# Patient Record
Sex: Female | Born: 1980 | Race: Black or African American | Hispanic: No | Marital: Single | Smoking: Never smoker
Health system: Southern US, Community
[De-identification: ages and names within clinical notes are randomized; demographics above are authoritative.]

## PROBLEM LIST (undated history)

## (undated) DIAGNOSIS — D649 Anemia, unspecified: Secondary | ICD-10-CM

## (undated) DIAGNOSIS — Z8489 Family history of other specified conditions: Secondary | ICD-10-CM

## (undated) DIAGNOSIS — F419 Anxiety disorder, unspecified: Secondary | ICD-10-CM

## (undated) DIAGNOSIS — N3090 Cystitis, unspecified without hematuria: Secondary | ICD-10-CM

## (undated) DIAGNOSIS — R200 Anesthesia of skin: Secondary | ICD-10-CM

## (undated) DIAGNOSIS — G935 Compression of brain: Secondary | ICD-10-CM

## (undated) HISTORY — PX: OTHER SURGICAL HISTORY: SHX169

## (undated) HISTORY — PX: WISDOM TOOTH EXTRACTION: SHX21

---

## 2015-08-29 DIAGNOSIS — R200 Anesthesia of skin: Secondary | ICD-10-CM

## 2015-08-29 HISTORY — DX: Anesthesia of skin: R20.0

## 2016-08-28 HISTORY — PX: CRANIECTOMY: SHX331

## 2017-07-06 ENCOUNTER — Other Ambulatory Visit: Payer: Self-pay | Admitting: Obstetrics and Gynecology

## 2017-07-06 DIAGNOSIS — D259 Leiomyoma of uterus, unspecified: Secondary | ICD-10-CM

## 2017-07-24 ENCOUNTER — Ambulatory Visit
Admission: RE | Admit: 2017-07-24 | Discharge: 2017-07-24 | Disposition: A | Discharge status: Home / Self Care | Payer: 59 | Source: Ambulatory Visit | Attending: Obstetrics and Gynecology | Admitting: Obstetrics and Gynecology

## 2017-07-24 DIAGNOSIS — D259 Leiomyoma of uterus, unspecified: Secondary | ICD-10-CM

## 2017-07-24 MED ORDER — GADOBENATE DIMEGLUMINE 529 MG/ML IV SOLN
20.0000 mL | Freq: Once | INTRAVENOUS | Status: AC | PRN
  Administered 2017-07-24: 20 mL via INTRAVENOUS

## 2017-08-17 ENCOUNTER — Other Ambulatory Visit: Payer: Self-pay | Admitting: Obstetrics and Gynecology

## 2017-08-17 DIAGNOSIS — R1900 Intra-abdominal and pelvic swelling, mass and lump, unspecified site: Secondary | ICD-10-CM

## 2017-08-22 ENCOUNTER — Ambulatory Visit
Admission: RE | Admit: 2017-08-22 | Discharge: 2017-08-22 | Disposition: A | Discharge status: Home / Self Care | Payer: 59 | Source: Ambulatory Visit | Attending: Obstetrics and Gynecology | Admitting: Obstetrics and Gynecology

## 2017-08-22 DIAGNOSIS — R1900 Intra-abdominal and pelvic swelling, mass and lump, unspecified site: Secondary | ICD-10-CM

## 2017-08-22 MED ORDER — IOPAMIDOL (ISOVUE-300) INJECTION 61%
125.0000 mL | Freq: Once | INTRAVENOUS | Status: AC | PRN
  Administered 2017-08-22: 125 mL via INTRAVENOUS

## 2017-08-29 ENCOUNTER — Ambulatory Visit: Payer: 59 | Admitting: Gynecologic Oncology

## 2017-10-05 ENCOUNTER — Other Ambulatory Visit: Payer: Self-pay | Admitting: Obstetrics and Gynecology

## 2017-10-09 NOTE — Patient Instructions (Addendum)
Your procedure is scheduled on: Thursday, Feb 21  Enter through the Main Entrance of [facility] at: 11:30 am  Pick up the phone at the desk and dial [phone].  Call this number if you have problems the morning of surgery: [phone].  Remember: Do NOT eat or Do NOT drink clear liquids (including water) after midnight Wednesday.  Take these medicines the morning of surgery with a SIP OF WATER: None  Stop herbal medications and supplements at this time.  Do NOT wear jewelry (body piercing), metal hair clips/bobby pins, make-up, or nail polish. Do NOT wear lotions, powders, or perfumes.  You may wear deoderant. Do NOT shave for 48 hours prior to surgery. Do NOT bring valuables to the hospital.  Leave suitcase in car.  After surgery it may be brought to your room.  For patients admitted to the hospital, checkout time is 11:00 AM the day of discharge. Have a responsible adult drive you home and stay with you for 24 hours after your procedure. Home with Mother Melanie Faulkner cell [phone].

## 2017-10-12 ENCOUNTER — Other Ambulatory Visit: Payer: Self-pay

## 2017-10-12 ENCOUNTER — Encounter (HOSPITAL_COMMUNITY): Payer: Self-pay

## 2017-10-12 ENCOUNTER — Encounter (HOSPITAL_COMMUNITY)
Admission: RE | Admit: 2017-10-12 | Discharge: 2017-10-12 | Disposition: A | Discharge status: Home / Self Care | Payer: 59 | Source: Ambulatory Visit | Attending: Obstetrics and Gynecology | Admitting: Obstetrics and Gynecology

## 2017-10-12 DIAGNOSIS — Z01812 Encounter for preprocedural laboratory examination: Secondary | ICD-10-CM | POA: Insufficient documentation

## 2017-10-12 HISTORY — DX: Anemia, unspecified: D64.9

## 2017-10-12 LAB — BASIC METABOLIC PANEL
Anion gap: 7 (ref 5–15)
BUN: 11 mg/dL (ref 6–20)
CALCIUM: 9.3 mg/dL (ref 8.9–10.3)
CO2: 25 mmol/L (ref 22–32)
Chloride: 104 mmol/L (ref 101–111)
Creatinine, Ser: 0.7 mg/dL (ref 0.44–1.00)
GFR calc Af Amer: >60 mL/min (ref >60)
GLUCOSE: 110 mg/dL — AB (ref 65–99)
Potassium: 3.8 mmol/L (ref 3.5–5.1)
Sodium: 136 mmol/L (ref 135–145)

## 2017-10-12 LAB — CBC
HEMATOCRIT: 28.8 % — AB (ref 36.0–46.0)
Hemoglobin: 7.8 g/dL — ABNORMAL LOW (ref 12.0–15.0)
MCH: 18.2 pg — ABNORMAL LOW (ref 26.0–34.0)
MCHC: 27.1 g/dL — AB (ref 30.0–36.0)
MCV: 67.3 fL — ABNORMAL LOW (ref 78.0–100.0)
Platelets: 287 10*3/uL (ref 150–400)
RBC: 4.28 MIL/uL (ref 3.87–5.11)
RDW: 21.7 % — AB (ref 11.5–15.5)
WBC: 5.4 10*3/uL (ref 4.0–10.5)

## 2017-10-12 LAB — ABO/RH: ABO/RH(D): AB POS

## 2017-10-12 NOTE — Pre-Procedure Instructions (Signed)
Dr. Royce Macadamia notified that patient's hgb today was 7.8.  Patient is taking iron supplement.  Telephone order to type and crossmatch 2 units of blood for DOS.  Verified verbal order.  Order was placed in Epic for DOS.

## 2017-10-15 ENCOUNTER — Encounter (HOSPITAL_COMMUNITY): Payer: Self-pay | Admitting: Anesthesiology

## 2017-10-18 ENCOUNTER — Encounter (HOSPITAL_COMMUNITY): Payer: Self-pay | Admitting: Emergency Medicine

## 2017-10-18 ENCOUNTER — Other Ambulatory Visit: Payer: Self-pay | Admitting: Obstetrics and Gynecology

## 2017-10-18 ENCOUNTER — Ambulatory Visit: Admit: 2017-10-18 | Payer: 59 | Admitting: Obstetrics and Gynecology

## 2017-10-18 ENCOUNTER — Ambulatory Visit (HOSPITAL_COMMUNITY)
Admission: AD | Admit: 2017-10-18 | Discharge: 2017-10-18 | DRG: 761 | Disposition: A | Discharge status: Home / Self Care | Payer: 59 | Source: Ambulatory Visit | Attending: Obstetrics and Gynecology | Admitting: Obstetrics and Gynecology

## 2017-10-18 ENCOUNTER — Encounter (HOSPITAL_COMMUNITY)
Admission: AD | Disposition: A | Discharge status: Home / Self Care | Payer: Self-pay | Source: Ambulatory Visit | Attending: Obstetrics and Gynecology

## 2017-10-18 DIAGNOSIS — Z5309 Procedure and treatment not carried out because of other contraindication: Secondary | ICD-10-CM | POA: Diagnosis present

## 2017-10-18 DIAGNOSIS — Z79899 Other long term (current) drug therapy: Secondary | ICD-10-CM | POA: Diagnosis not present

## 2017-10-18 DIAGNOSIS — Z882 Allergy status to sulfonamides status: Secondary | ICD-10-CM | POA: Diagnosis not present

## 2017-10-18 DIAGNOSIS — D259 Leiomyoma of uterus, unspecified: Secondary | ICD-10-CM | POA: Diagnosis not present

## 2017-10-18 HISTORY — DX: Compression of brain: G93.5

## 2017-10-18 LAB — PREGNANCY, URINE: PREG TEST UR: NEGATIVE

## 2017-10-18 LAB — PREPARE RBC (CROSSMATCH)

## 2017-10-18 SURGERY — LAPAROTOMY, EXPLORATORY
Anesthesia: General

## 2017-10-18 SURGERY — HYSTERECTOMY, TOTAL, ABDOMINAL, WITH SALPINGECTOMY
Anesthesia: General | Site: Abdomen | Laterality: Bilateral

## 2017-10-18 MED ORDER — LIDOCAINE HCL (CARDIAC) 20 MG/ML IV SOLN
INTRAVENOUS | Status: AC
  Filled 2017-10-18: qty 5

## 2017-10-18 MED ORDER — SCOPOLAMINE 1 MG/3DAYS TD PT72
MEDICATED_PATCH | TRANSDERMAL | Status: AC
  Administered 2017-10-18: 1.5 mg via TRANSDERMAL
  Filled 2017-10-18: qty 1

## 2017-10-18 MED ORDER — DEXTROSE 5 % IV SOLN
3.0000 g | INTRAVENOUS | Status: AC
  Filled 2017-10-18: qty 3000

## 2017-10-18 MED ORDER — ONDANSETRON HCL 4 MG/2ML IJ SOLN
INTRAMUSCULAR | Status: AC
  Filled 2017-10-18: qty 2

## 2017-10-18 MED ORDER — SODIUM CHLORIDE 0.9 % IV SOLN
510.0000 mg | INTRAVENOUS | Status: DC
  Filled 2017-10-18: qty 17

## 2017-10-18 MED ORDER — MIDAZOLAM HCL 2 MG/2ML IJ SOLN
INTRAMUSCULAR | Status: AC
  Filled 2017-10-18: qty 2

## 2017-10-18 MED ORDER — PROPOFOL 10 MG/ML IV BOLUS
INTRAVENOUS | Status: AC
  Filled 2017-10-18: qty 20

## 2017-10-18 MED ORDER — FENTANYL CITRATE (PF) 250 MCG/5ML IJ SOLN
INTRAMUSCULAR | Status: AC
  Filled 2017-10-18: qty 5

## 2017-10-18 MED ORDER — DEXAMETHASONE SODIUM PHOSPHATE 10 MG/ML IJ SOLN
INTRAMUSCULAR | Status: AC
  Filled 2017-10-18: qty 1

## 2017-10-18 MED ORDER — LACTATED RINGERS IV SOLN
INTRAVENOUS | Status: DC
  Administered 2017-10-18: 12:00:00 via INTRAVENOUS

## 2017-10-18 MED ORDER — SCOPOLAMINE 1 MG/3DAYS TD PT72
1.0000 | MEDICATED_PATCH | Freq: Once | TRANSDERMAL | Status: AC
  Administered 2017-10-18: 1.5 mg via TRANSDERMAL

## 2017-10-18 NOTE — OR Nursing (Signed)
Pt surgery was canceled. Verbal orders received from Dr. Maudry Diego for an IV Iron transfusion prior to discharge. Iron transfusion was given but I was unable to chart it because pt was canceled in Epic prior to charting.   Therefore.Melanie Faulkner 510mg /171ml IV was given over 15 mins via IV pump. Pt was monitored by continous pulse ox and observed for 30 minutes post infusion.  Pt showed no signs of allergic reaction.  Pt discharged in NAD.

## 2017-10-18 NOTE — Anesthesia Preprocedure Evaluation (Deleted)
Anesthesia Evaluation  Patient identified by MRN, date of birth, ID band Patient awake    Reviewed: Allergy & Precautions, NPO status , Patient's Chart, lab work & pertinent test results  Airway Mallampati: II  TM Distance: >3 FB Neck ROM: Full    Dental no notable dental hx.    Pulmonary neg pulmonary ROS,    Pulmonary exam normal breath sounds clear to auscultation       Cardiovascular negative cardio ROS Normal cardiovascular exam Rhythm:Regular Rate:Normal     Neuro/Psych negative neurological ROS  negative psych ROS   GI/Hepatic negative GI ROS, Neg liver ROS,   Endo/Other  negative endocrine ROS  Renal/GU negative Renal ROS  negative genitourinary   Musculoskeletal negative musculoskeletal ROS (+)   Abdominal   Peds  Hematology  (+) anemia ,   Anesthesia Other Findings   Reproductive/Obstetrics negative OB ROS                            Lab Results  Component Value Date   WBC 5.4 10/12/2017   HGB 7.8 (L) 10/12/2017   HCT 28.8 (L) 10/12/2017   MCV 67.3 (L) 10/12/2017   PLT 287 10/12/2017   Lab Results  Component Value Date   CREATININE 0.70 10/12/2017   BUN 11 10/12/2017   NA 136 10/12/2017   K 3.8 10/12/2017   CL 104 10/12/2017   CO2 25 10/12/2017    Anesthesia Physical Anesthesia Plan  ASA: IV  Anesthesia Plan: General   Post-op Pain Management:    Induction: Intravenous  PONV Risk Score and Plan: 4 or greater and Treatment may vary due to age or medical condition, Ondansetron, Dexamethasone and Scopolamine patch - Pre-op  Airway Management Planned: Oral ETT  Additional Equipment:   Intra-op Plan:   Post-operative Plan: Extubation in OR  Informed Consent: I have reviewed the patients History and Physical, chart, labs and discussed the procedure including the risks, benefits and alternatives for the proposed anesthesia with the patient or authorized  representative who has indicated his/her understanding and acceptance.   Dental advisory given  Plan Discussed with: CRNA  Anesthesia Plan Comments:         Anesthesia Quick Evaluation

## 2017-10-18 NOTE — H&P (Signed)
Melanie Faulkner is an 37 y.o. female Go SBF presents for planned exp lap, TAH, bilateral salpingectomy due to symptomatic uterine fibroid. CT scan/MRI Showed 19 cm fibroid uterus. EBX benign endometrium Pertinent Gynecological History: Menses: regular Bleeding: nl Contraception: none DES exposure: denies Blood transfusions: none Sexually transmitted diseases: no past history Previous GYN Procedures: none  Last mammogram: n/a Date: 2018 Last pap: abnormal: LSIL (+) HPV Date: 09/2016  OB History: G0   Menstrual History: Menarche age: n/a Patient's last menstrual period was 09/28/2017 (exact date).    Past Medical History:  Diagnosis Date  . Anemia   . 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 CRANIECTOMY  2018   Nolene Ebbs 1 Malformatin w/syrina -[facility]  . saliva gland removed    . WISDOM TOOTH EXTRACTION      History reviewed. No pertinent family history.  Social History:  reports that  has never smoked. she has never used smokeless tobacco. She reports that she does not drink alcohol or use drugs.  Allergies:  Allergies  Allergen Reactions  . Sulfa Antibiotics Other (See Comments)    Fever/chills    Medications Prior to Admission  Medication Sig Dispense Refill Last Dose  . aspirin-acetaminophen-caffeine (EXCEDRIN MIGRAINE) 250-250-65 MG tablet Take 1-2 tablets by mouth daily as needed for headache.   Past Month at Unknown time  . CHASTE TREE PO Take 1 capsule by mouth daily.   Past Month at Unknown time  . IRON PO Take 1 tablet by mouth daily.   10/17/2017 at Unknown time  . Noni, Morinda citrifolia, (NONI JUICE PO) Take 30 mLs by mouth 2 (two) times daily.   10/17/2017 at Unknown time  . RASPBERRY PO Take 1 capsule by mouth daily. Red Raspberry   Past Month at Unknown time  . RED CLOVER LEAF EXTRACT PO Take 1 capsule by mouth daily.   Past Month at Unknown time  . VALERIAN PO Take 2-3 capsules by mouth  at bedtime.   Past Month at Unknown time  . ibuprofen (ADVIL,MOTRIN) 200 MG tablet Take 800 mg by mouth at bedtime.   More than a month at Unknown time    Review of Systems  All other systems reviewed and are negative.   Blood pressure 138/77, pulse 97, temperature 97.8 F (36.6 C), temperature source Oral, resp. rate 16, height 5\' 4"  (1.626 m), weight 120.7 kg (266 lb), last menstrual period 09/28/2017, SpO2 100 %. Physical Exam  Constitutional: She is oriented to person, place, and time. She appears well-developed and well-nourished.  HENT:  Head: Atraumatic.  Eyes: EOM are normal.  Neck: Neck supple.  Respiratory: Breath sounds normal.  GI: She exhibits mass.  @ umbilicus  Genitourinary:  Genitourinary Comments: Vulva no lesion Vagina: no lesion Cervix closed Uterus 20 weeks irreg Adnexa nonpalp  Neurological: She is alert and oriented to person, place, and time.  Skin: Skin is warm and dry.  Psychiatric: She has a normal mood and affect.    Results for orders placed or performed during the hospital encounter of 10/18/17 (from the past 24 hour(s))  Prepare RBC (crossmatch)     Status: None   Collection Time: 10/18/17 11:30 AM  Result Value Ref Range   Order Confirmation      ORDER PROCESSED BY BLOOD BANK Performed at [facility], [address], [place], [place] [address]     No results found.  Assessment/Plan: Symptomatic uterine fibroid  P)exp lap, TAH, bilateral salpingectomy Risk of surgery includes infection, bleeding, injury to bladder, ureter, bowels, possible need for blood transfusion and its risk( HIV, fever/rash, hepatitis), poss need for removal of ovaries in the future due to malignancy, pain etc. All ? Answered. No future fertility or childbearing ability  Stephan Draughn A Lilliemae Fruge 10/18/2017, 11:33 AM

## 2017-10-18 NOTE — Progress Notes (Signed)
Pt presented for surgery hgb 7.8 Unknownst to me. Pt was informed of this finding. Surgery is therefore cancelled  Pt understands. Will give one IV iron infusion today and repeat one week later. Agree with plan. Will also look at lupron .

## 2017-10-19 ENCOUNTER — Other Ambulatory Visit: Payer: Self-pay | Admitting: Obstetrics and Gynecology

## 2017-10-25 ENCOUNTER — Ambulatory Visit (HOSPITAL_COMMUNITY)
Admission: RE | Admit: 2017-10-25 | Discharge: 2017-10-25 | Disposition: A | Discharge status: Home / Self Care | Payer: 59 | Source: Ambulatory Visit | Attending: Obstetrics and Gynecology | Admitting: Obstetrics and Gynecology

## 2017-10-25 ENCOUNTER — Ambulatory Visit (HOSPITAL_COMMUNITY): Payer: Self-pay

## 2017-10-25 ENCOUNTER — Ambulatory Visit (HOSPITAL_COMMUNITY): Payer: 59

## 2017-10-25 DIAGNOSIS — D509 Iron deficiency anemia, unspecified: Secondary | ICD-10-CM | POA: Insufficient documentation

## 2017-10-25 MED ORDER — SODIUM CHLORIDE 0.9 % IV SOLN
510.0000 mg | Freq: Once | INTRAVENOUS | Status: AC
  Administered 2017-10-25: 510 mg via INTRAVENOUS
  Filled 2017-10-25: qty 17

## 2017-10-25 MED ORDER — SODIUM CHLORIDE 0.9 % IV SOLN: 510.0000 mg | Freq: Once | INTRAVENOUS | Status: DC

## 2017-10-25 NOTE — Discharge Instructions (Signed)

## 2017-10-25 NOTE — Progress Notes (Signed)
PATIENT CARE CENTER NOTE  Diagnosis: Iron Deficiency Anemia    Provider: Dr. Robert Bellow   Procedure: IV Iron infusion (Feraheme)   Note: Patient received infusion of IV Feraheme. Patient tolerated infusion well with no adverse reaction. Vitals stable and patient monitored 30 minutes after completion of transfusion. Discharge instructions given to patient. Patient alert, oriented and ambulatory at discharge.

## 2017-10-26 LAB — BPAM RBC
Blood Product Expiration Date: 201903142359
Blood Product Expiration Date: 201903142359
Unit Type and Rh: 6200
Unit Type and Rh: 6200

## 2017-10-26 LAB — TYPE AND SCREEN
ABO/RH(D): AB POS
ANTIBODY SCREEN: NEGATIVE
UNIT DIVISION: 0
Unit division: 0

## 2017-11-30 ENCOUNTER — Other Ambulatory Visit: Payer: Self-pay | Admitting: Obstetrics and Gynecology

## 2017-12-04 NOTE — Patient Instructions (Addendum)
Your procedure is scheduled on: Monday December 17, 2017 at 1:00 pm  Enter through the Main Entrance of [facility] at: 11:30 am  Pick up the phone at the desk and dial [phone].  Call this number if you have problems the morning of surgery: [phone].  Remember: Do NOT eat food: after Midnight on Sunday April 21  Do NOT drink clear liquids after: 6:00 am day of surgery Take these medicines the morning of surgery with a SIP OF WATER: None  STOP ALL VITAMINS AND SUPPLEMENTS 1 WEEK PRIOR TO SURGERY  DO NOT SMOKE DAY OF SURGERY  Do NOT wear jewelry (body piercing), metal hair clips/bobby pins, make-up, or nail polish. Do NOT wear lotions, powders, or perfumes.  You may wear deoderant. Do NOT shave for 48 hours prior to surgery. Do NOT bring valuables to the hospital. Contacts, dentures, or bridgework may not be worn into surgery. Leave suitcase in car.  After surgery it may be brought to your room.    For patients admitted to the hospital, checkout time is 11:00 AM the day of discharge.

## 2017-12-07 ENCOUNTER — Encounter (HOSPITAL_COMMUNITY): Payer: Self-pay

## 2017-12-07 ENCOUNTER — Encounter (HOSPITAL_COMMUNITY)
Admission: RE | Admit: 2017-12-07 | Discharge: 2017-12-07 | Disposition: A | Discharge status: Home / Self Care | Payer: 59 | Source: Ambulatory Visit | Attending: Obstetrics and Gynecology | Admitting: Obstetrics and Gynecology

## 2017-12-07 ENCOUNTER — Other Ambulatory Visit: Payer: Self-pay

## 2017-12-07 DIAGNOSIS — Z01812 Encounter for preprocedural laboratory examination: Secondary | ICD-10-CM | POA: Diagnosis present

## 2017-12-07 HISTORY — DX: Anxiety disorder, unspecified: F41.9

## 2017-12-07 HISTORY — DX: Cystitis, unspecified without hematuria: N30.90

## 2017-12-07 HISTORY — DX: Anesthesia of skin: R20.0

## 2017-12-07 HISTORY — DX: Family history of other specified conditions: Z84.89

## 2017-12-07 LAB — CBC
HEMATOCRIT: 39.7 % (ref 36.0–46.0)
HEMOGLOBIN: 12.3 g/dL (ref 12.0–15.0)
MCH: 24.9 pg — ABNORMAL LOW (ref 26.0–34.0)
MCHC: 31 g/dL (ref 30.0–36.0)
MCV: 80.4 fL (ref 78.0–100.0)
PLATELETS: 183 10*3/uL (ref 150–400)
RBC: 4.94 MIL/uL (ref 3.87–5.11)
RDW: 24.7 % — AB (ref 11.5–15.5)
WBC: 3.7 10*3/uL — AB (ref 4.0–10.5)

## 2017-12-07 LAB — BASIC METABOLIC PANEL
ANION GAP: 6 (ref 5–15)
BUN: 10 mg/dL (ref 6–20)
CO2: 27 mmol/L (ref 22–32)
Calcium: 9.9 mg/dL (ref 8.9–10.3)
Chloride: 104 mmol/L (ref 101–111)
Creatinine, Ser: 0.68 mg/dL (ref 0.44–1.00)
GFR calc Af Amer: >60 mL/min (ref >60)
Glucose, Bld: 90 mg/dL (ref 65–99)
POTASSIUM: 3.8 mmol/L (ref 3.5–5.1)
SODIUM: 137 mmol/L (ref 135–145)

## 2017-12-07 LAB — TYPE AND SCREEN
ABO/RH(D): AB POS
Antibody Screen: NEGATIVE

## 2017-12-17 ENCOUNTER — Encounter (HOSPITAL_COMMUNITY): Payer: Self-pay | Admitting: Anesthesiology

## 2017-12-17 ENCOUNTER — Inpatient Hospital Stay (HOSPITAL_COMMUNITY): Payer: 59 | Admitting: Anesthesiology

## 2017-12-17 ENCOUNTER — Encounter (HOSPITAL_COMMUNITY)
Admission: RE | Disposition: A | Discharge status: Home / Self Care | Payer: Self-pay | Source: Ambulatory Visit | Attending: Obstetrics and Gynecology

## 2017-12-17 ENCOUNTER — Inpatient Hospital Stay (HOSPITAL_COMMUNITY)
Admission: RE | Admit: 2017-12-17 | Discharge: 2017-12-18 | DRG: 742 | Disposition: A | Discharge status: Home / Self Care | Payer: 59 | Attending: Obstetrics and Gynecology | Admitting: Obstetrics and Gynecology

## 2017-12-17 ENCOUNTER — Other Ambulatory Visit: Payer: Self-pay

## 2017-12-17 DIAGNOSIS — Z6841 Body Mass Index (BMI) 40.0 and over, adult: Secondary | ICD-10-CM | POA: Diagnosis not present

## 2017-12-17 DIAGNOSIS — D259 Leiomyoma of uterus, unspecified: Principal | ICD-10-CM | POA: Diagnosis present

## 2017-12-17 DIAGNOSIS — D649 Anemia, unspecified: Secondary | ICD-10-CM | POA: Diagnosis present

## 2017-12-17 DIAGNOSIS — Z90710 Acquired absence of both cervix and uterus: Secondary | ICD-10-CM | POA: Diagnosis present

## 2017-12-17 HISTORY — PX: HYSTERECTOMY ABDOMINAL WITH SALPINGECTOMY: SHX6725

## 2017-12-17 LAB — PREGNANCY, URINE: Preg Test, Ur: NEGATIVE

## 2017-12-17 SURGERY — HYSTERECTOMY, TOTAL, ABDOMINAL, WITH SALPINGECTOMY
Anesthesia: General | Site: Abdomen | Laterality: Bilateral

## 2017-12-17 MED ORDER — SIMETHICONE 80 MG PO CHEW: 80.0000 mg | CHEWABLE_TABLET | Freq: Four times a day (QID) | ORAL | Status: DC | PRN

## 2017-12-17 MED ORDER — PROPOFOL 10 MG/ML IV BOLUS
INTRAVENOUS | Status: DC | PRN
  Administered 2017-12-17: 200 mg via INTRAVENOUS

## 2017-12-17 MED ORDER — FENTANYL CITRATE (PF) 100 MCG/2ML IJ SOLN
INTRAMUSCULAR | Status: DC | PRN
  Administered 2017-12-17: 100 ug via INTRAVENOUS
  Administered 2017-12-17: 50 ug via INTRAVENOUS
  Administered 2017-12-17: 100 ug via INTRAVENOUS
  Administered 2017-12-17: 25 ug via INTRAVENOUS
  Administered 2017-12-17: 100 ug via INTRAVENOUS
  Administered 2017-12-17: 25 ug via INTRAVENOUS

## 2017-12-17 MED ORDER — HYDROMORPHONE HCL 1 MG/ML IJ SOLN
INTRAMUSCULAR | Status: DC | PRN
  Administered 2017-12-17: 1 mg via INTRAVENOUS

## 2017-12-17 MED ORDER — SCOPOLAMINE 1 MG/3DAYS TD PT72
1.0000 | MEDICATED_PATCH | Freq: Once | TRANSDERMAL | Status: DC
  Administered 2017-12-17: 1.5 mg via TRANSDERMAL

## 2017-12-17 MED ORDER — PROMETHAZINE HCL 25 MG/ML IJ SOLN: 6.2500 mg | INTRAMUSCULAR | Status: DC | PRN

## 2017-12-17 MED ORDER — PROPOFOL 10 MG/ML IV BOLUS
INTRAVENOUS | Status: AC
  Filled 2017-12-17: qty 20

## 2017-12-17 MED ORDER — KETOROLAC TROMETHAMINE 30 MG/ML IJ SOLN
INTRAMUSCULAR | Status: DC | PRN
  Administered 2017-12-17: 30 mg via INTRAVENOUS

## 2017-12-17 MED ORDER — SODIUM CHLORIDE 0.9 % IJ SOLN
INTRAMUSCULAR | Status: AC
  Filled 2017-12-17: qty 50

## 2017-12-17 MED ORDER — SODIUM CHLORIDE 0.9% FLUSH: 9.0000 mL | INTRAVENOUS | Status: DC | PRN

## 2017-12-17 MED ORDER — LIDOCAINE HCL (CARDIAC) PF 100 MG/5ML IV SOSY
PREFILLED_SYRINGE | INTRAVENOUS | Status: AC
  Filled 2017-12-17: qty 5

## 2017-12-17 MED ORDER — KETOROLAC TROMETHAMINE 30 MG/ML IJ SOLN
30.0000 mg | Freq: Four times a day (QID) | INTRAMUSCULAR | Status: DC
  Filled 2017-12-17: qty 1

## 2017-12-17 MED ORDER — DIPHENHYDRAMINE HCL 12.5 MG/5ML PO ELIX: 12.5000 mg | ORAL_SOLUTION | Freq: Four times a day (QID) | ORAL | Status: DC | PRN

## 2017-12-17 MED ORDER — MIDAZOLAM HCL 2 MG/2ML IJ SOLN
INTRAMUSCULAR | Status: AC
  Filled 2017-12-17: qty 2

## 2017-12-17 MED ORDER — MENTHOL 3 MG MT LOZG
1.0000 | LOZENGE | OROMUCOSAL | Status: DC | PRN
Start: 2017-12-17 — End: 2017-12-18

## 2017-12-17 MED ORDER — SCOPOLAMINE 1 MG/3DAYS TD PT72
MEDICATED_PATCH | TRANSDERMAL | Status: AC
  Administered 2017-12-17: 1.5 mg via TRANSDERMAL
  Filled 2017-12-17: qty 1

## 2017-12-17 MED ORDER — MIDAZOLAM HCL 2 MG/2ML IJ SOLN
INTRAMUSCULAR | Status: DC | PRN
  Administered 2017-12-17: 0.5 mg via INTRAVENOUS
  Administered 2017-12-17: 1.5 mg via INTRAVENOUS

## 2017-12-17 MED ORDER — CEFAZOLIN SODIUM-DEXTROSE 2-4 GM/100ML-% IV SOLN
INTRAVENOUS | Status: AC
  Filled 2017-12-17: qty 100

## 2017-12-17 MED ORDER — HYDROMORPHONE HCL 1 MG/ML IJ SOLN: 0.2500 mg | INTRAMUSCULAR | Status: DC | PRN

## 2017-12-17 MED ORDER — ONDANSETRON HCL 4 MG/2ML IJ SOLN
4.0000 mg | Freq: Four times a day (QID) | INTRAMUSCULAR | Status: DC | PRN
Start: 2017-12-17 — End: 2017-12-18

## 2017-12-17 MED ORDER — KETOROLAC TROMETHAMINE 30 MG/ML IJ SOLN
30.0000 mg | Freq: Four times a day (QID) | INTRAMUSCULAR | Status: DC
  Administered 2017-12-17 – 2017-12-18 (×3): 30 mg via INTRAVENOUS
  Filled 2017-12-17 (×2): qty 1

## 2017-12-17 MED ORDER — SUGAMMADEX SODIUM 500 MG/5ML IV SOLN
INTRAVENOUS | Status: AC
  Filled 2017-12-17: qty 5

## 2017-12-17 MED ORDER — LIDOCAINE HCL (CARDIAC) PF 100 MG/5ML IV SOSY
PREFILLED_SYRINGE | INTRAVENOUS | Status: DC | PRN
  Administered 2017-12-17: 80 mg via INTRAVENOUS

## 2017-12-17 MED ORDER — CEFAZOLIN SODIUM-DEXTROSE 2-4 GM/100ML-% IV SOLN
2.0000 g | INTRAVENOUS | Status: AC
  Administered 2017-12-17: 2 g via INTRAVENOUS

## 2017-12-17 MED ORDER — DEXAMETHASONE SODIUM PHOSPHATE 10 MG/ML IJ SOLN
INTRAMUSCULAR | Status: AC
Start: 2017-12-17 — End: ?
  Filled 2017-12-17: qty 1

## 2017-12-17 MED ORDER — LACTATED RINGERS IV SOLN
INTRAVENOUS | Status: DC
  Administered 2017-12-17 (×4): via INTRAVENOUS

## 2017-12-17 MED ORDER — ROCURONIUM BROMIDE 100 MG/10ML IV SOLN
INTRAVENOUS | Status: AC
  Filled 2017-12-17: qty 1

## 2017-12-17 MED ORDER — ONDANSETRON HCL 4 MG/2ML IJ SOLN: 4.0000 mg | Freq: Four times a day (QID) | INTRAMUSCULAR | Status: DC | PRN

## 2017-12-17 MED ORDER — PANTOPRAZOLE SODIUM 40 MG PO TBEC
40.0000 mg | DELAYED_RELEASE_TABLET | Freq: Every day | ORAL | Status: DC
  Administered 2017-12-18: 40 mg via ORAL
  Filled 2017-12-17: qty 1

## 2017-12-17 MED ORDER — ONDANSETRON HCL 4 MG/2ML IJ SOLN
INTRAMUSCULAR | Status: AC
  Filled 2017-12-17: qty 2

## 2017-12-17 MED ORDER — ROCURONIUM BROMIDE 100 MG/10ML IV SOLN
INTRAVENOUS | Status: DC | PRN
  Administered 2017-12-17: 10 mg via INTRAVENOUS
  Administered 2017-12-17: 50 mg via INTRAVENOUS

## 2017-12-17 MED ORDER — FENTANYL CITRATE (PF) 250 MCG/5ML IJ SOLN
INTRAMUSCULAR | Status: AC
Start: 2017-12-17 — End: ?
  Filled 2017-12-17: qty 5

## 2017-12-17 MED ORDER — ONDANSETRON HCL 4 MG PO TABS: 4.0000 mg | ORAL_TABLET | Freq: Four times a day (QID) | ORAL | Status: DC | PRN

## 2017-12-17 MED ORDER — ONDANSETRON HCL 4 MG/2ML IJ SOLN
INTRAMUSCULAR | Status: DC | PRN
  Administered 2017-12-17 (×2): 2 mg via INTRAVENOUS

## 2017-12-17 MED ORDER — DIPHENHYDRAMINE HCL 50 MG/ML IJ SOLN: 12.5000 mg | Freq: Four times a day (QID) | INTRAMUSCULAR | Status: DC | PRN

## 2017-12-17 MED ORDER — BUPIVACAINE HCL (PF) 0.25 % IJ SOLN
INTRAMUSCULAR | Status: AC
  Filled 2017-12-17: qty 30

## 2017-12-17 MED ORDER — NALOXONE HCL 0.4 MG/ML IJ SOLN: 0.4000 mg | INTRAMUSCULAR | Status: DC | PRN

## 2017-12-17 MED ORDER — IBUPROFEN 800 MG PO TABS: 800.0000 mg | ORAL_TABLET | Freq: Three times a day (TID) | ORAL | Status: DC | PRN

## 2017-12-17 MED ORDER — BUPIVACAINE HCL (PF) 0.25 % IJ SOLN
INTRAMUSCULAR | Status: DC | PRN
  Administered 2017-12-17: 7 mL

## 2017-12-17 MED ORDER — GLYCOPYRROLATE 0.2 MG/ML IJ SOLN
INTRAMUSCULAR | Status: DC | PRN
  Administered 2017-12-17 (×2): 0.1 mg via INTRAVENOUS

## 2017-12-17 MED ORDER — HYDROMORPHONE HCL 1 MG/ML IJ SOLN: 0.2000 mg | INTRAMUSCULAR | Status: DC | PRN

## 2017-12-17 MED ORDER — FENTANYL CITRATE (PF) 100 MCG/2ML IJ SOLN
INTRAMUSCULAR | Status: AC
  Filled 2017-12-17: qty 2

## 2017-12-17 MED ORDER — FENTANYL CITRATE (PF) 100 MCG/2ML IJ SOLN
INTRAMUSCULAR | Status: AC
Start: 2017-12-17 — End: ?
  Filled 2017-12-17: qty 2

## 2017-12-17 MED ORDER — KETOROLAC TROMETHAMINE 30 MG/ML IJ SOLN
INTRAMUSCULAR | Status: AC
  Filled 2017-12-17: qty 1

## 2017-12-17 MED ORDER — DEXTROSE IN LACTATED RINGERS 5 % IV SOLN
INTRAVENOUS | Status: DC
  Administered 2017-12-17: 19:00:00 via INTRAVENOUS
  Administered 2017-12-18: 125 mL/h via INTRAVENOUS

## 2017-12-17 MED ORDER — DEXAMETHASONE SODIUM PHOSPHATE 10 MG/ML IJ SOLN
INTRAMUSCULAR | Status: DC | PRN
  Administered 2017-12-17: 10 mg via INTRAVENOUS

## 2017-12-17 MED ORDER — SODIUM CHLORIDE 0.9 % IJ SOLN
INTRAMUSCULAR | Status: AC
  Filled 2017-12-17: qty 10

## 2017-12-17 MED ORDER — GLYCOPYRROLATE 0.2 MG/ML IJ SOLN
INTRAMUSCULAR | Status: AC
  Filled 2017-12-17: qty 1

## 2017-12-17 MED ORDER — SUGAMMADEX SODIUM 200 MG/2ML IV SOLN
INTRAVENOUS | Status: DC | PRN
  Administered 2017-12-17: 240 mg via INTRAVENOUS

## 2017-12-17 MED ORDER — HYDROMORPHONE 1 MG/ML IV SOLN
INTRAVENOUS | Status: DC
  Administered 2017-12-17: 18:00:00 via INTRAVENOUS
  Filled 2017-12-17: qty 25

## 2017-12-17 MED ORDER — VASOPRESSIN 20 UNIT/ML IV SOLN
INTRAVENOUS | Status: AC
  Filled 2017-12-17: qty 3

## 2017-12-17 SURGICAL SUPPLY — 46 items
BAG URINE DRAINAGE (UROLOGICAL SUPPLIES) ×3 IMPLANT
BENZOIN TINCTURE PRP APPL 2/3 (GAUZE/BANDAGES/DRESSINGS) IMPLANT
CANISTER SUCT 3000ML PPV (MISCELLANEOUS) ×3 IMPLANT
CATH FOLEY 3WAY  5CC 16FR (CATHETERS) ×2
CATH FOLEY 3WAY 5CC 16FR (CATHETERS) ×1 IMPLANT
CLOSURE WOUND 1/2 X4 (GAUZE/BANDAGES/DRESSINGS)
CONT PATH 16OZ SNAP LID 3702 (MISCELLANEOUS) IMPLANT
DECANTER SPIKE VIAL GLASS SM (MISCELLANEOUS) ×3 IMPLANT
DRAPE CESAREAN BIRTH W POUCH (DRAPES) ×3 IMPLANT
DRAPE WARM FLUID 44X44 (DRAPE) IMPLANT
DRESSING DISP NPWT PICO 4X16 (MISCELLANEOUS) ×6 IMPLANT
DRSG OPSITE POSTOP 4X10 (GAUZE/BANDAGES/DRESSINGS) ×3 IMPLANT
DURAPREP 26ML APPLICATOR (WOUND CARE) ×3 IMPLANT
GAUZE SPONGE 4X4 16PLY XRAY LF (GAUZE/BANDAGES/DRESSINGS) ×3 IMPLANT
GLOVE BIO SURGEON STRL SZ 6.5 (GLOVE) ×2 IMPLANT
GLOVE BIO SURGEONS STRL SZ 6.5 (GLOVE) ×1
GLOVE BIOGEL PI IND STRL 6.5 (GLOVE) ×1 IMPLANT
GLOVE BIOGEL PI IND STRL 7.0 (GLOVE) ×3 IMPLANT
GLOVE BIOGEL PI INDICATOR 6.5 (GLOVE) ×2
GLOVE BIOGEL PI INDICATOR 7.0 (GLOVE) ×6
GLOVE ECLIPSE 6.5 STRL STRAW (GLOVE) ×3 IMPLANT
GLOVE NEODERM STER SZ 7 (GLOVE) ×3 IMPLANT
GOWN STRL REUS W/TWL LRG LVL3 (GOWN DISPOSABLE) ×9 IMPLANT
LIGASURE IMPACT 36 18CM CVD LR (INSTRUMENTS) IMPLANT
NEEDLE HYPO 22GX1.5 SAFETY (NEEDLE) IMPLANT
NS IRRIG 1000ML POUR BTL (IV SOLUTION) ×3 IMPLANT
PACK ABDOMINAL GYN (CUSTOM PROCEDURE TRAY) ×3 IMPLANT
PAD OB MATERNITY 4.3X12.25 (PERSONAL CARE ITEMS) ×3 IMPLANT
PROTECTOR NERVE ULNAR (MISCELLANEOUS) ×6 IMPLANT
RTRCTR C-SECT PINK 25CM LRG (MISCELLANEOUS) ×3 IMPLANT
SPONGE LAP 18X18 X RAY DECT (DISPOSABLE) ×9 IMPLANT
STAPLER VISISTAT 35W (STAPLE) IMPLANT
STRIP CLOSURE SKIN 1/2X4 (GAUZE/BANDAGES/DRESSINGS) IMPLANT
SUT PLAIN 2 0 XLH (SUTURE) ×6 IMPLANT
SUT PROLENE 0 CT 1 30 (SUTURE) IMPLANT
SUT VIC AB 0 CT1 18XCR BRD8 (SUTURE) ×4 IMPLANT
SUT VIC AB 0 CT1 36 (SUTURE) ×15 IMPLANT
SUT VIC AB 0 CT1 8-18 (SUTURE) ×8
SUT VIC AB 2-0 CT1 27 (SUTURE) ×2
SUT VIC AB 2-0 CT1 TAPERPNT 27 (SUTURE) ×1 IMPLANT
SUT VIC AB 3-0 SH 27 (SUTURE) ×6
SUT VIC AB 3-0 SH 27X BRD (SUTURE) ×3 IMPLANT
SUT VIC AB 4-0 KS 27 (SUTURE) ×3 IMPLANT
SUT VICRYL 0 TIES 12 18 (SUTURE) ×3 IMPLANT
SYR CONTROL 10ML LL (SYRINGE) IMPLANT
TOWEL OR 17X24 6PK STRL BLUE (TOWEL DISPOSABLE) ×6 IMPLANT

## 2017-12-17 NOTE — Brief Op Note (Signed)
12/17/2017  4:34 PM  PATIENT:  Melanie Faulkner  37 y.o. female  PRE-OPERATIVE DIAGNOSIS:  Symptomatic Fibroids  POST-OPERATIVE DIAGNOSIS:  Symptomatic Fibroids  PROCEDURE:  Exp lap, total abdominal hysterectomy, bilateral salpingectomy  SURGEON:  Surgeon(s) and Role:    * Servando Salina, MD - Primary    * Almquist, Earlyne Iba, MD - Assisting  PHYSICIAN ASSISTANT:   ASSISTANTS: Almquist, susan, MD   ANESTHESIA:   general FINDINGS: large fibroid uterus, nl tubes, right ovary with per iovarian adhesions EBL:  300 mL   BLOOD ADMINISTERED:none  DRAINS: none   LOCAL MEDICATIONS USED:  MARCAINE     SPECIMEN:  Source of Specimen:  uterus, cervix, tubes  DISPOSITION OF SPECIMEN:  PATHOLOGY  COUNTS:  YES  TOURNIQUET:  * No tourniquets in log *  DICTATION: .Other Dictation: Dictation Number [id number] PLAN OF CARE: Admit to inpatient   PATIENT DISPOSITION:  PACU - hemodynamically stable.   Delay start of Pharmacological VTE agent (>24hrs) due to surgical blood loss or risk of bleeding: no

## 2017-12-17 NOTE — Anesthesia Procedure Notes (Signed)
Procedure Name: Intubation Performed by: Josephine Igo, MD Pre-anesthesia Checklist: Patient identified, Patient being monitored, Timeout performed, Emergency Drugs available and Suction available Patient Re-evaluated:Patient Re-evaluated prior to induction Oxygen Delivery Method: Circle System Utilized Preoxygenation: Pre-oxygenation with 100% oxygen Induction Type: IV induction Ventilation: Mask ventilation without difficulty Laryngoscope Size: Mac, Sabra Heck and 2 Grade View: Grade II Tube type: Oral Tube size: 7.0 mm Number of attempts: 1 Airway Equipment and Method: stylet Placement Confirmation: ETT inserted through vocal cords under direct vision,  positive ETCO2 and breath sounds checked- equal and bilateral Secured at: 22 cm Tube secured with: Tape Dental Injury: Teeth and Oropharynx as per pre-operative assessment

## 2017-12-17 NOTE — Transfer of Care (Signed)
Immediate Anesthesia Transfer of Care Note  Patient: Melanie Faulkner  Procedure(s) Performed: Exploratory Laparotomy/HYSTERECTOMY ABDOMINAL WITH Bilateral  SALPINGECTOMY (Bilateral Abdomen)  Patient Location: PACU  Anesthesia Type:General  Level of Consciousness: awake, alert , oriented, drowsy and patient cooperative  Airway & Oxygen Therapy: Patient Spontanous Breathing and Patient connected to nasal cannula oxygen  Post-op Assessment: Report given to RN and Post -op Vital signs reviewed and stable  Post vital signs: Reviewed and stable  Last Vitals:  Vitals Value Taken Time  BP 131/82 12/17/2017  4:16 PM  Temp    Pulse 109 12/17/2017  4:21 PM  Resp 13 12/17/2017  4:21 PM  SpO2 100 % 12/17/2017  4:21 PM  Vitals shown include unvalidated device data.  Last Pain:  Vitals:   12/17/17 1121  TempSrc: Oral  PainSc: 2       Patients Stated Pain Goal: 4 (68/08/81 1031)  Complications: No apparent anesthesia complications

## 2017-12-17 NOTE — Anesthesia Postprocedure Evaluation (Signed)
Anesthesia Post Note  Patient: Melanie Faulkner  Procedure(s) Performed: Exploratory Laparotomy/HYSTERECTOMY ABDOMINAL WITH Bilateral  SALPINGECTOMY (Bilateral Abdomen)     Patient location during evaluation: PACU Anesthesia Type: General Level of consciousness: awake and alert and oriented Pain management: pain level controlled Vital Signs Assessment: post-procedure vital signs reviewed and stable Respiratory status: spontaneous breathing, nonlabored ventilation, respiratory function stable and patient connected to nasal cannula oxygen Cardiovascular status: blood pressure returned to baseline and stable Postop Assessment: no apparent nausea or vomiting Anesthetic complications: no    Last Vitals:  Vitals:   12/17/17 1715 12/17/17 1730  BP: 132/72 138/85  Pulse: 83 96  Resp: 16 14  Temp:  36.5 C  SpO2: 100% 100%    Last Pain:  Vitals:   12/17/17 1715  TempSrc:   PainSc: 2    Pain Goal: Patients Stated Pain Goal: 4 (12/17/17 1715)               Moussa Wiegand A.

## 2017-12-17 NOTE — Anesthesia Preprocedure Evaluation (Signed)
Anesthesia Evaluation  Patient identified by MRN, date of birth, ID band Patient awake    Reviewed: Allergy & Precautions, Patient's Chart, lab work & pertinent test results  History of Anesthesia Complications (+) Family history of anesthesia reaction  Airway Mallampati: II  TM Distance: >3 FB Neck ROM: Full    Dental no notable dental hx. 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    GI/Hepatic negative GI ROS, Neg liver ROS,   Endo/Other  Morbid obesity  Renal/GU negative Renal ROS  negative genitourinary   Musculoskeletal negative musculoskeletal ROS (+)   Abdominal (+) + obese,   Peds  Hematology  (+) anemia ,   Anesthesia Other Findings   Reproductive/Obstetrics Symptomatic fibroids                             Anesthesia Physical Anesthesia Plan  ASA: III  Anesthesia Plan: General   Post-op Pain Management:    Induction: Intravenous  PONV Risk Score and Plan: 4 or greater and Scopolamine patch - Pre-op, Midazolam, Ondansetron, Dexamethasone and Treatment may vary due to age or medical condition  Airway Management Planned: Oral ETT  Additional Equipment:   Intra-op Plan:   Post-operative Plan: Extubation in OR  Informed Consent: I have reviewed the patients History and Physical, chart, labs and discussed the procedure including the risks, benefits and alternatives for the proposed anesthesia with the patient or authorized representative who has indicated his/her understanding and acceptance.   Dental advisory given  Plan Discussed with: CRNA and Surgeon  Anesthesia Plan Comments:         Anesthesia Quick Evaluation

## 2017-12-18 ENCOUNTER — Encounter (HOSPITAL_COMMUNITY): Payer: Self-pay | Admitting: Obstetrics and Gynecology

## 2017-12-18 LAB — CBC
HEMATOCRIT: 34.7 % — AB (ref 36.0–46.0)
Hemoglobin: 10.9 g/dL — ABNORMAL LOW (ref 12.0–15.0)
MCH: 25.2 pg — AB (ref 26.0–34.0)
MCHC: 31.4 g/dL (ref 30.0–36.0)
MCV: 80.3 fL (ref 78.0–100.0)
Platelets: 138 10*3/uL — ABNORMAL LOW (ref 150–400)
RBC: 4.32 MIL/uL (ref 3.87–5.11)
RDW: 22.5 % — AB (ref 11.5–15.5)
WBC: 6.1 10*3/uL (ref 4.0–10.5)

## 2017-12-18 LAB — BASIC METABOLIC PANEL
ANION GAP: 8 (ref 5–15)
BUN: 6 mg/dL (ref 6–20)
CALCIUM: 9.6 mg/dL (ref 8.9–10.3)
CO2: 24 mmol/L (ref 22–32)
Chloride: 107 mmol/L (ref 101–111)
Creatinine, Ser: 0.64 mg/dL (ref 0.44–1.00)
GFR calc Af Amer: >60 mL/min (ref >60)
GFR calc non Af Amer: >60 mL/min (ref >60)
GLUCOSE: 152 mg/dL — AB (ref 65–99)
Potassium: 4.4 mmol/L (ref 3.5–5.1)
Sodium: 139 mmol/L (ref 135–145)

## 2017-12-18 MED ORDER — OXYCODONE-ACETAMINOPHEN 5-325 MG PO TABS
1.0000 | ORAL_TABLET | ORAL | 0 refills | Status: AC | PRN
Start: 2017-12-18 — End: 2017-12-25

## 2017-12-18 MED ORDER — IBUPROFEN 800 MG PO TABS: 800.0000 mg | ORAL_TABLET | Freq: Three times a day (TID) | ORAL | 6 refills | Status: AC | PRN

## 2017-12-18 NOTE — Discharge Summary (Signed)
Physician Discharge Summary  Patient ID: Melanie Faulkner MRN: [id number] DOB/AGE: 1981-03-31 37 y.o.  Admit date: 12/17/2017 Discharge date: 12/18/2017  Admission Diagnoses: symptomatic uterine fibroids  Discharge Diagnoses: symptomatic uterine fibroids Active Problems:   Status post total hysterectomy   Discharged Condition: stable  Hospital Course: pt underwent exp lap, total abdominal hysterectomy, bilateral salpingectomy. See operative report. She had unremarkable postop course  Consults: None  Significant Diagnostic Studies: labs:  CBC Latest Ref Rng & Units 12/18/2017 12/07/2017 10/12/2017  WBC 4.0 - 10.5 K/uL 6.1 3.7(L) 5.4  Hemoglobin 12.0 - 15.0 g/dL 10.9(L) 12.3 7.8(L)  Hematocrit 36.0 - 46.0 % 34.7(L) 39.7 28.8(L)  Platelets 150 - 400 K/uL 138(L) 183 287   BMP Latest Ref Rng & Units 12/18/2017 12/07/2017 10/12/2017  Glucose 65 - 99 mg/dL 152(H) 90 110(H)  BUN 6 - 20 mg/dL 6 10 11   Creatinine 0.44 - 1.00 mg/dL 0.64 0.68 0.70  Sodium 135 - 145 mmol/L 139 137 136  Potassium 3.5 - 5.1 mmol/L 4.4 3.8 3.8  Chloride 101 - 111 mmol/L 107 104 104  CO2 22 - 32 mmol/L 24 27 25   Calcium 8.9 - 10.3 mg/dL 9.6 9.9 9.3    Treatments: surgery: exp lap, total abdominal hysterectomy, bilateral salpingectomy  Discharge Exam: Blood pressure 112/72, pulse 72, temperature 98.1 F (36.7 C), temperature source Oral, resp. rate 16, height 5' 4.02" (1.626 m), weight 117.9 kg (260 lb), last menstrual period 11/26/2017, SpO2 100 %. General appearance: alert, cooperative and no distress Resp: clear to auscultation bilaterally Cardio: regular rate and rhythm, S1, S2 normal, no murmur, click, rub or gallop GI: soft obese non distended. active BS. PICO dressing with small brown stain Pelvic: deferred Extremities: no edema, redness or tenderness in the calves or thighs Pad scant brown blood Disposition: Discharge disposition: 01-Home or Self Care       Discharge Instructions    Call MD for:   persistant nausea and vomiting   Complete by:  As directed    Call MD for:  severe uncontrolled pain   Complete by:  As directed    Call MD for:  temperature >100.4   Complete by:  As directed    Diet general   Complete by:  As directed    Discharge instructions   Complete by:  As directed    Call if temperature greater than equal to 100.4, nothing per vagina for 4-6 weeks or severe nausea vomiting, increased incisional pain , drainage or redness in the incision site, no straining with bowel movements, showers no bath   May walk up steps   Complete by:  As directed      Allergies as of 12/18/2017      Reactions   Sulfa Antibiotics Other (See Comments)   Fever/chills      Medication List    TAKE these medications   CHASTE TREE PO Take 1 capsule by mouth daily.   ibuprofen 800 MG tablet Commonly known as:  ADVIL,MOTRIN Take 1 tablet (800 mg total) by mouth every 8 (eight) hours as needed (mild pain). What changed:    medication strength  how much to take  when to take this  reasons to take this   NON FORMULARY Take 1 capsule by mouth daily. BURDOCK ROOT   NONI JUICE PO Take 30 mLs by mouth 2 (two) times daily.   oxyCODONE-acetaminophen 5-325 MG tablet Commonly known as:  PERCOCET/ROXICET Take 1 tablet by mouth every 4 (four) hours as needed for up to  7 days for severe pain.   PERFECT IRON 25 MG Tabs Generic drug:  Carbonyl Iron Take 25 mg by mouth daily. EASY IRON      Follow-up Information    Servando Salina, MD Follow up today.   Specialty:  Obstetrics and Gynecology Why:  remove dressing monday office with RN Contact information: [address] [place] [address] [phone]           Signed: Alanda Slim A Rayshon Albaugh 12/18/2017, 1:55 PM

## 2017-12-18 NOTE — Progress Notes (Signed)
Subjective: Patient reports tolerating PO and no problems voiding did not use PCA for pain. No flatus as yet. Denies pain.    Objective: I have reviewed patient's vital signs.  vital signs, intake and output and labs. Vitals:   12/18/17 0541 12/18/17 0744  BP:  (!) 124/93  Pulse:  86  Resp: 18 18  Temp:  97.9 F (36.6 C)  SpO2: 100% 97%   I/O last 3 completed shifts: In: 5831.2 [P.O.:680; I.V.:5151.2] Out: 4450 [Urine:4150; Blood:300] No intake/output data recorded.  Lab Results  Component Value Date   WBC 6.1 12/18/2017   HGB 10.9 (L) 12/18/2017   HCT 34.7 (L) 12/18/2017   MCV 80.3 12/18/2017   PLT 138 (L) 12/18/2017   Lab Results  Component Value Date   CREATININE 0.64 12/18/2017    EXAM General: alert, cooperative and no distress Resp: clear to auscultation bilaterally Cardio: regular rate and rhythm, S1, S2 normal, no murmur, click, rub or gallop GI: soft, non-tender; bowel sounds normal; no masses,  no organomegaly and incision: dry, intact and small area of dried blood noted Extremities: extremities normal, atraumatic, no cyanosis or edema Vaginal Bleeding: minimal  Assessment: s/p Procedure(s): Exploratory Laparotomy/HYSTERECTOMY ABDOMINAL WITH Bilateral  SALPINGECTOMY: stable, progressing well and tolerating diet  Plan: Advance diet Encourage ambulation Advance to PO medication if flatus will discharge home. pt excited at that prospect  LOS: 1 day    Marvene Staff, MD 12/18/2017 7:49 AM    12/18/2017, 7:49 AM

## 2017-12-19 NOTE — Op Note (Signed)
NAME:  Melanie Faulkner, Melanie Faulkner NO.:  MEDICAL RECORD NO.:  [id number]  LOCATION:                                 FACILITY:  PHYSICIAN:  Servando Salina, M.D.    DATE OF BIRTH:  DATE OF PROCEDURE:  12/17/2017 DATE OF DISCHARGE:                              OPERATIVE REPORT   PREOPERATIVE DIAGNOSIS:  Symptomatic uterine fibroids.  PROCEDURES:  Exploratory laparotomy, total abdominal hysterectomy, bilateral salpingectomy.  POSTOPERATIVE DIAGNOSIS:  Symptomatic uterine fibroids.  ANESTHESIA:  General.  SURGEON:  Servando Salina, M.D.  ASSISTANT:  Tiana Loft, MD.  DESCRIPTION OF PROCEDURE:  Under adequate general anesthesia, the patient was placed in the supine position.  A 3-way indwelling Foley catheter was sterilely placed.  Examination under anesthesia had revealed a mobile uterus about 16-week size amenable to a transverse incision.  The patient was, therefore prepped and draped in the usual fashion.  Marcaine 0.25% was injected along the Pfannenstiel skin incision site.  Pfannenstiel skin incision was then made, carried down to the rectus fascia, the patient about a 3-inch depth to her subcutaneous area.  The rectus fascia was opened transversely.  Rectus fascia was then bluntly and sharply dissected off the rectus muscle in superior and inferior fashion.  The rectus muscles were split in midline.  The parietal peritoneum was entered bluntly and extended. Exploration of the upper abdomen was notable for normal liver edge, normal palpable kidneys.  The uterus was noted to be enlarged with multiple fibroids.  A self-retaining Alexis retractor was then placed. The round ligaments were identified bilaterally.  They were grasped, suture ligated proximally and distally and intervening segment was opened with cautery, followed by the vesicouterine peritoneum, which was then opened transversely and the bladder displaced inferiorly with blunt dissection.   At that point, the uterus was then exteriorized.  On the right, the posterior leaf of the broad ligament was opened.  The right utero-ovarian ligament was thought to be clamped, cut, and suture ligated and tied with 0 Vicryl.  On further inspection, the ovary, which was adherent to the posterior wall had not had its ovarian ligament included and therefore, the periovarian adhesions were lysed, freeing up that ovary and that ovarian ligament was then doubly clamped, cut, suture ligated and free tied with 0 Vicryl.  The bladder was further displaced inferiorly.  The uterine vessels were prominent.  They were skeletonized.  Uterine vessels were then doubly clamped, cut, and suture ligated with 0 Vicryl x2.  The same procedure was performed on the contralateral side, removed fallopian tubes remaining at that point. Once the both uterines were clamped, cut, and sutured.  At the cervicovaginal junction, the uterus was then truncated at that point, for better visualization of field.  The bowels were packed upwardly. The stump of the cervix was grasped and the cardinal ligament was bilaterally clamped, cut, and suture ligated with 0 Vicryl.  The uterosacral ligaments were bilaterally clamped, cut, and suture ligated as well.  This was carried down until the cervicovaginal junction was reached anteriorly.  It was then noted that initial attempt to open the anterior vaginal cuff area was unsuccessful, therefore the  right angle was clamped, cut, and suture ligated with 0 Vicryl.  It gave a partial entry into the cervicovaginal junction with using a right angle scissors.  The cervix was then detached from its vaginal attachment. Posteriorly, there was appears to be a part of the cervix that had remained and therefore, the vaginal cuff was opened that area that was appeared to be thickened was removed separately and the vaginal cuff was then inspected. A bleed in the posterior vaginal cuff resulted in  a figure-of-eight sutures being placed.  A left angle suture was placed using a modified Richardson method.  The vaginal cuff was then oversewn circumferentially with 0 Vicryl suture and the vaginal cuff was then closed with interrupted 0 Vicryl figure-of-eight sutures.  Attention was then turned to the fallopian tubes.  The mesosalpinx was bilaterally serially clamped, cut, and suture ligated with 3-0 Vicryl suture until both fallopian tubes were removed.  Pedicle on the right had some bleeding, which resulted in re-clamped, suture ligated with 0 Vicryl. Both ureters were identified.  The right was seen peristalsing from the pelvic brim down.  On the left, the left retroperitoneal space was opened and dissection was then done with the ureter of normal caliber being seen peristalsing  on the medial aspect on the left side.  The ovarian pedicles were then suspended to the round ligaments bilaterally. The abdomen was irrigated and suctioned of debris.  The bowel packings were removed.  The Alexis retractor was then removed with good hemostasis noted.  The parietal peritoneum was closed with 2-0 Vicryl. The rectus fascia was closed with 0 Vicryl x2.  Two layer closure of the subcutaneous area with 2-0 plain sutures was then done, 4-0 Vicryl subcuticular closure was then done of the skin and a pico PICO dressing was placed to reduce the risk of infection.  SPECIMEN:  Uterus with cervix and tubes sent to Pathology.  Weight of 949.9 gm.  ESTIMATED BLOOD LOSS:  300 cc.  INTRAOPERATIVE FLUID:  2.5 L.  URINE OUTPUT:  50 mL, clear yellow urine.  COUNTS:  Sponge and instrument counts x2 were correct.  COMPLICATION:  None.  The patient tolerated the procedure well, was transferred to recovery room in stable condition.     Servando Salina, M.D.     [place]/MEDQ  D:  12/18/2017  T:  12/18/2017  Job:  [id number]

## 2019-01-04 IMAGING — CT CT ABD-PEL WO/W CM
2 of 8 series · 11 of 46 positions shown, 17 images · IV contrast (iopamidol)
Comparison: No priors CT the abdomen and pelvis. MRI of the pelvis
07/24/2017.

CLINICAL DATA: 36-year-old female with history of right-sided flank
mass noted on prior MRI in June 2017.

EXAM:
CT ABDOMEN AND PELVIS WITHOUT AND WITH CONTRAST
TECHNIQUE: Multidetector CT imaging of the abdomen and pelvis was performed
following the standard protocol before and following the bolus
administration of intravenous contrast.
CONTRAST:  125mL TBDGQF-7BB IOPAMIDOL (TBDGQF-7BB) INJECTION 61%

[Series 2: abd/pelvis w/(date) · axial · 0.94mm/px · z∈[-587,-237]mm · 8 of 91 slices shown, 13 images]
[im 11/91  soft-tissue]
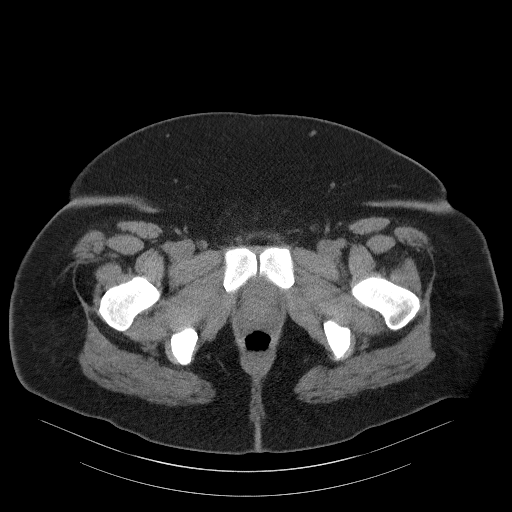
[im 11/91  bone]
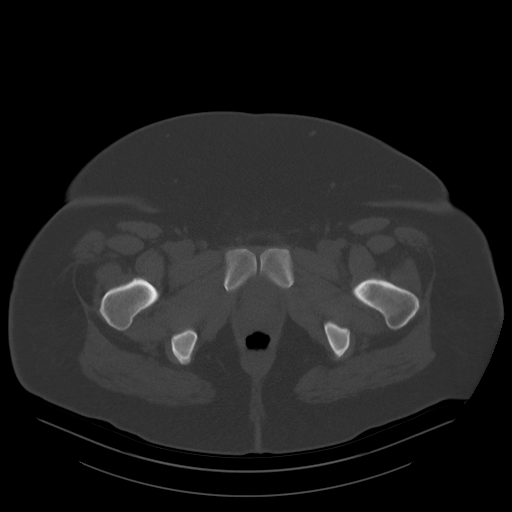
[im 21/91  soft-tissue]
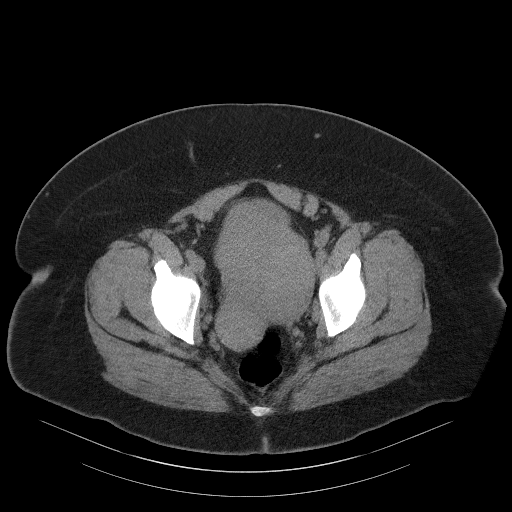
[im 31/91  soft-tissue]
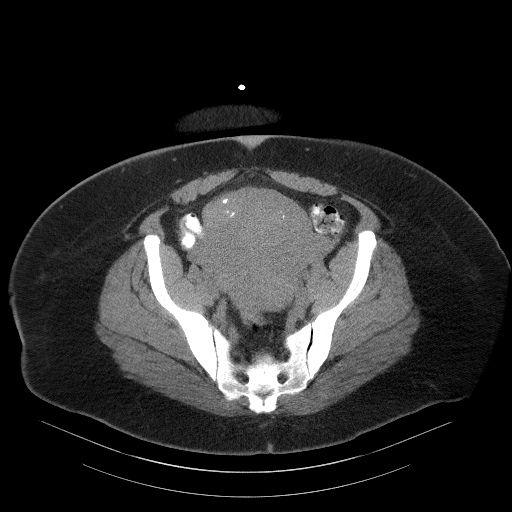
[im 41/91  soft-tissue]
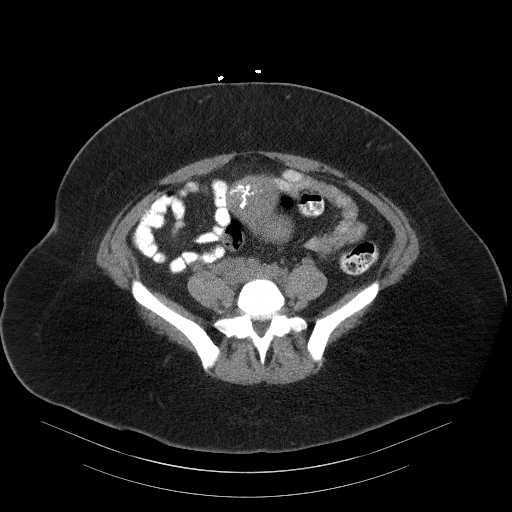
[im 51/91  soft-tissue]
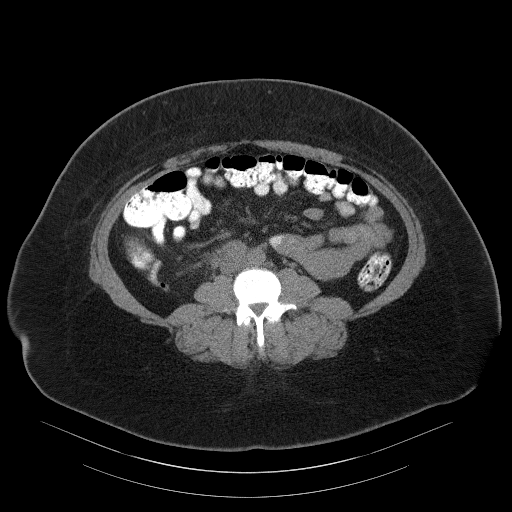
[im 51/91  lung]
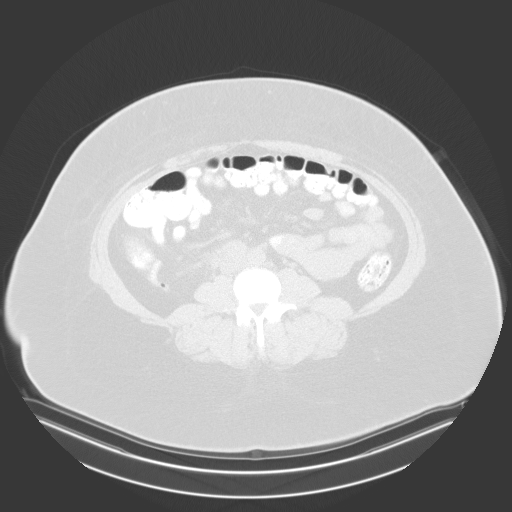
[im 61/91  soft-tissue]
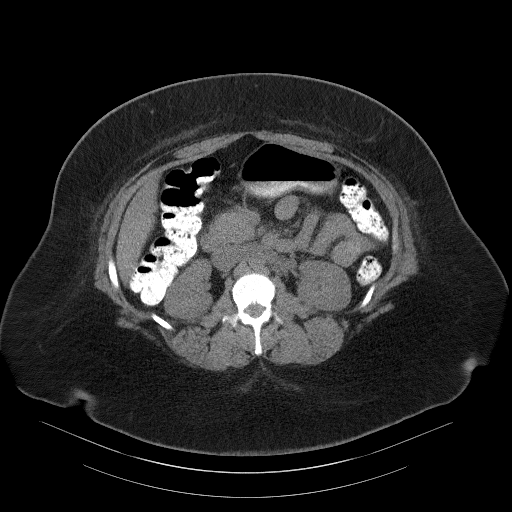
[im 61/91  lung]
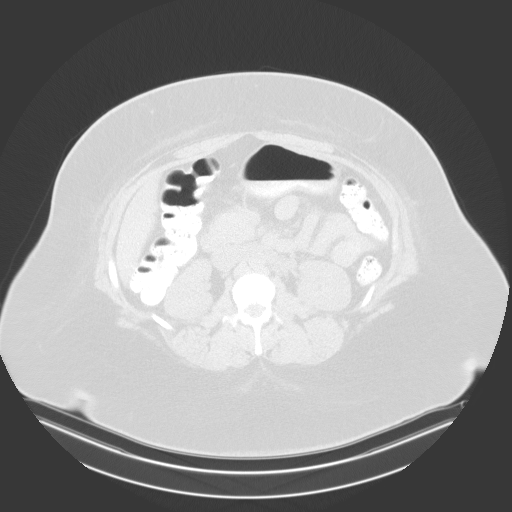
[im 71/91  soft-tissue]
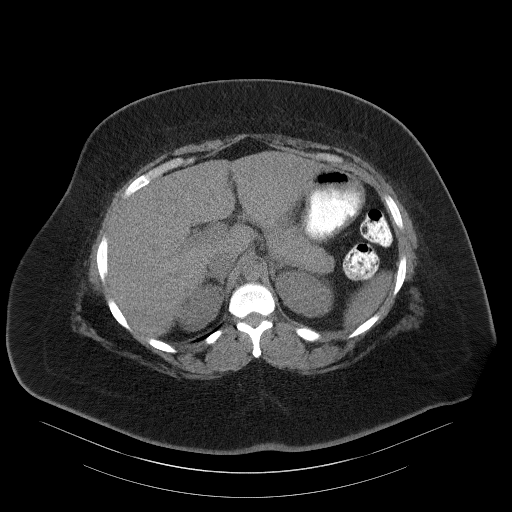
[im 71/91  lung]
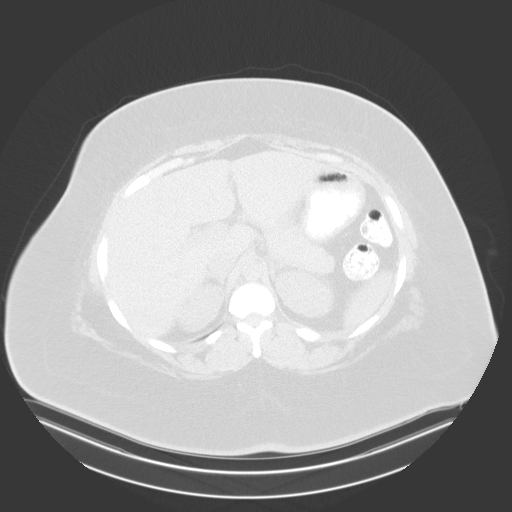
[im 81/91  soft-tissue]
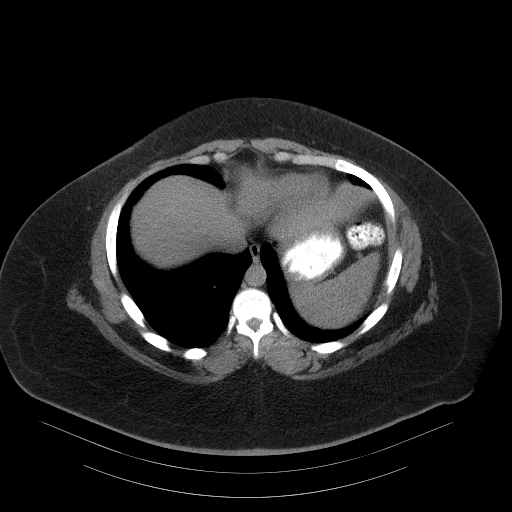
[im 81/91  lung]
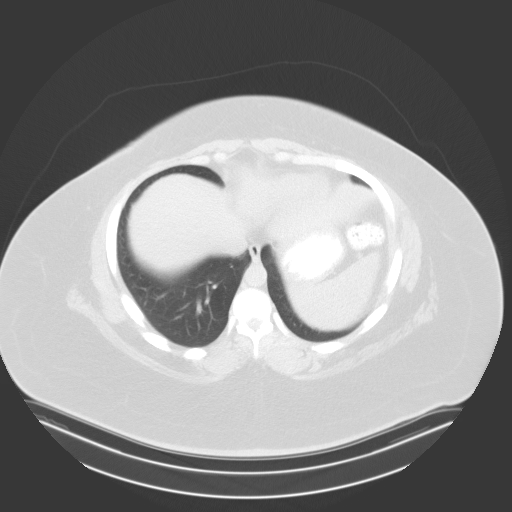

[Series 4: cor w/(date) · coronal · 0.88mm/px · 3 of 125 slices shown, 4 images]
[im 32/125  soft-tissue]
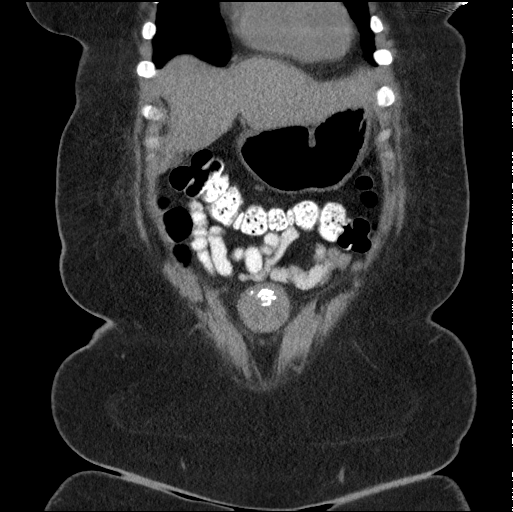
[im 63/125  soft-tissue]
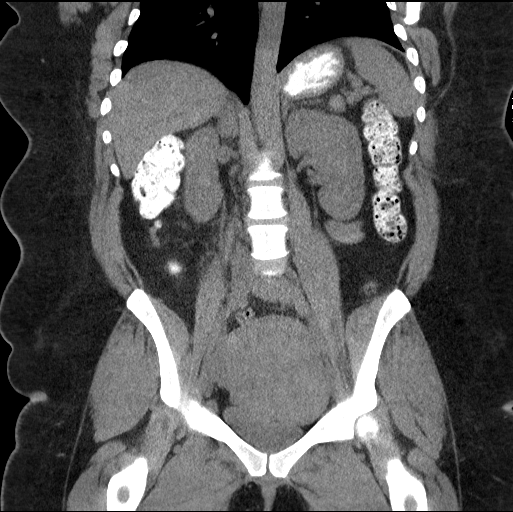
[im 63/125  bone]
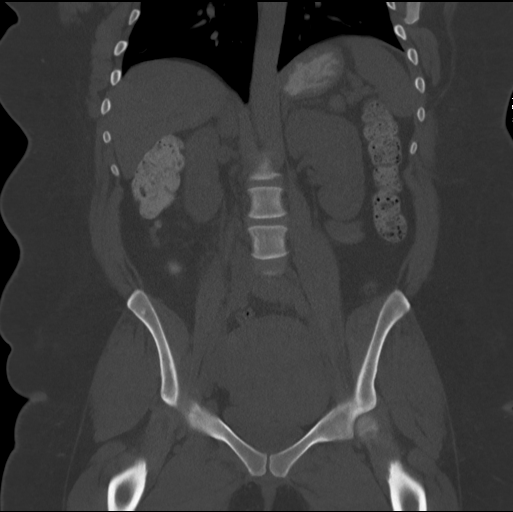
[im 94/125  soft-tissue]
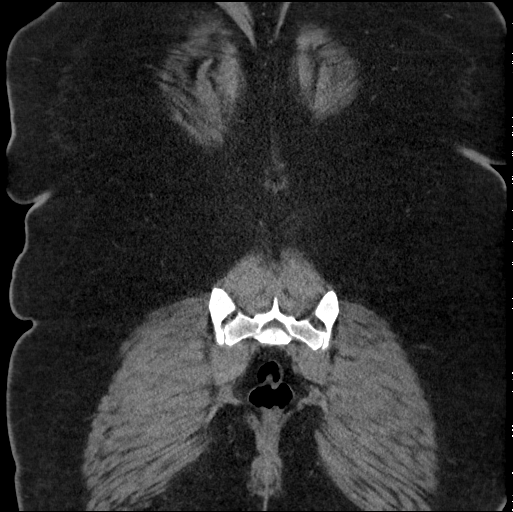

[11 of 46 positions shown; findings below may reference images not displayed]

FINDINGS: Lower chest: Unremarkable.

Hepatobiliary: No cystic or solid hepatic lesions. Diffuse low
attenuation throughout the hepatic parenchyma, compatible with a
background of hepatic steatosis. No intra or extrahepatic biliary
ductal dilatation. Gallbladder is normal in appearance.

Pancreas: No pancreatic mass. No pancreatic ductal dilatation. No
pancreatic or peripancreatic fluid or inflammatory changes.

Spleen: Unremarkable.

Adrenals/Urinary Tract: Bilateral adrenal glands and bilateral
kidneys are normal in appearance. There is no hydroureteronephrosis.
Urinary bladder is normal in appearance.

Stomach/Bowel: Normal appearance of the stomach. No pathologic
dilatation of small bowel or colon. Normal appendix.

Vascular/Lymphatic: No significant atherosclerotic disease, aneurysm
or dissection noted in the abdominal or pelvic vasculature. Enlarged
lymph nodes in the right side of the retroperitoneum measuring up to
1.5 cm along the expected course of the right gonadal vein (axial
image 45 of series 3). No pelvic lymphadenopathy.

Reproductive: Markedly enlarged and heterogeneous appearing uterus
with multiple lesions of varying degrees of enhancement and
calcification, compatible with a fibroid uterus, estimated to
measure up to 9.4 x 19.4 x 12.5 cm. The largest single lesion in the
uterus measures up to 5.8 x 7.8 x 4.9 cm in the left side of the
lower uterine segment. Ovaries are unremarkable in appearance.

Other: The previously noted cystic appearing structure in the right
side of the retroperitoneum is no longer identified. There is a
small amount of soft tissue stranding in this region (axial image 45
of series 3) with some adjacent mildly enlarged lymph nodes
(discussed above), presumably reactive. No significant volume of
ascites. No pneumoperitoneum.

Musculoskeletal: There are no aggressive appearing lytic or blastic
lesions noted in the visualized portions of the skeleton.
IMPRESSION: 1. Previously noted cystic appearing mass in the right
retroperitoneum is not identified on today's CT examination. Given
the rapid resolution of this lesion, this was presumably of benign
etiology, potentially an atypical hydrosalpinx.
2. There is some soft tissue stranding in the region that the lesion
was previously noted, and some mildly enlarged lymph nodes, which
are favored to be reactive.
3. Fibroid uterus.
